# Patient Record
Sex: Male | Born: 1965 | Race: White | Marital: Single | Smoking: Never smoker
Health system: Southern US, Community
[De-identification: ages and names within clinical notes are randomized; demographics above are authoritative.]

## PROBLEM LIST (undated history)

## (undated) DIAGNOSIS — K219 Gastro-esophageal reflux disease without esophagitis: Secondary | ICD-10-CM

---

## 2015-03-16 ENCOUNTER — Other Ambulatory Visit: Payer: Self-pay | Admitting: Student

## 2015-04-15 ENCOUNTER — Other Ambulatory Visit: Payer: Self-pay | Admitting: Student

## 2015-04-15 DIAGNOSIS — M24159 Other articular cartilage disorders, unspecified hip: Secondary | ICD-10-CM

## 2015-04-15 DIAGNOSIS — M169 Osteoarthritis of hip, unspecified: Principal | ICD-10-CM

## 2015-05-30 ENCOUNTER — Ambulatory Visit
Admission: RE | Admit: 2015-05-30 | Discharge: 2015-05-30 | Disposition: A | Discharge status: Home / Self Care | Payer: Non-veteran care | Source: Ambulatory Visit | Attending: Student | Admitting: Student

## 2015-05-30 DIAGNOSIS — M24159 Other articular cartilage disorders, unspecified hip: Secondary | ICD-10-CM

## 2015-05-30 DIAGNOSIS — M169 Osteoarthritis of hip, unspecified: Principal | ICD-10-CM

## 2015-10-02 HISTORY — PX: TOTAL HIP ARTHROPLASTY: SHX124

## 2016-05-04 IMAGING — MR MR HIP*L* W/O CM
4 of 6 series · 15 of 40 positions shown · non-contrast
Comparison: None.

CLINICAL DATA: Left hip pain for 6 years.

EXAM:
MR OF THE LEFT HIP WITHOUT CONTRAST
TECHNIQUE: Multiplanar, multisequence MR imaging was performed. No intravenous
contrast was administered.

[Series 4: T1 · coronal · 4.0mm · 0.49mm/px · 3 of 25 slices shown (1 of 2)]
[im 5/25]
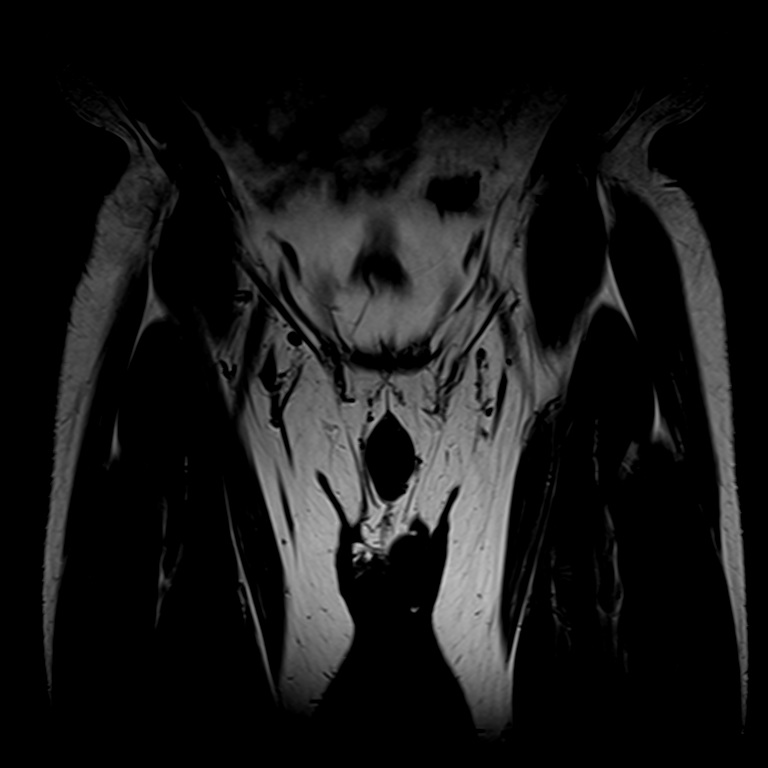
[im 15/25]
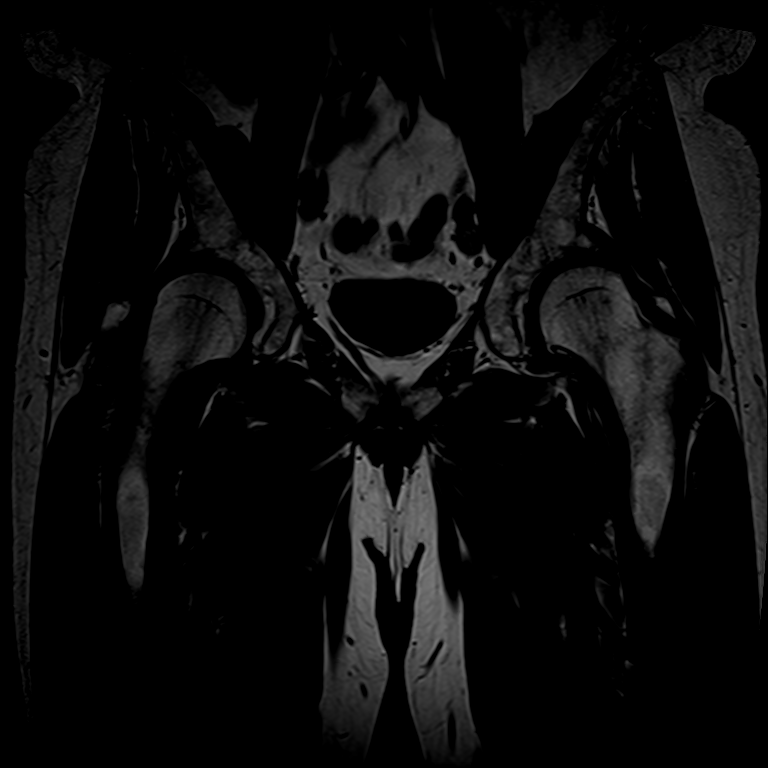
[im 25/25]
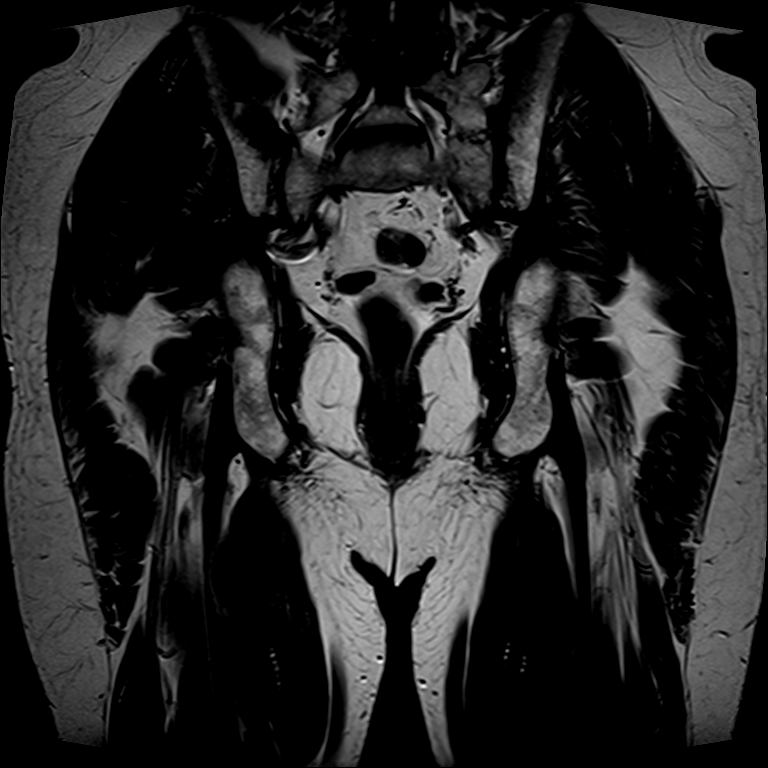

[Series 5: T2 fat-sat · axial · 4.0mm · 0.52mm/px · z∈[-41,+79]mm · 3 of 34 slices shown]
[im 5/34]
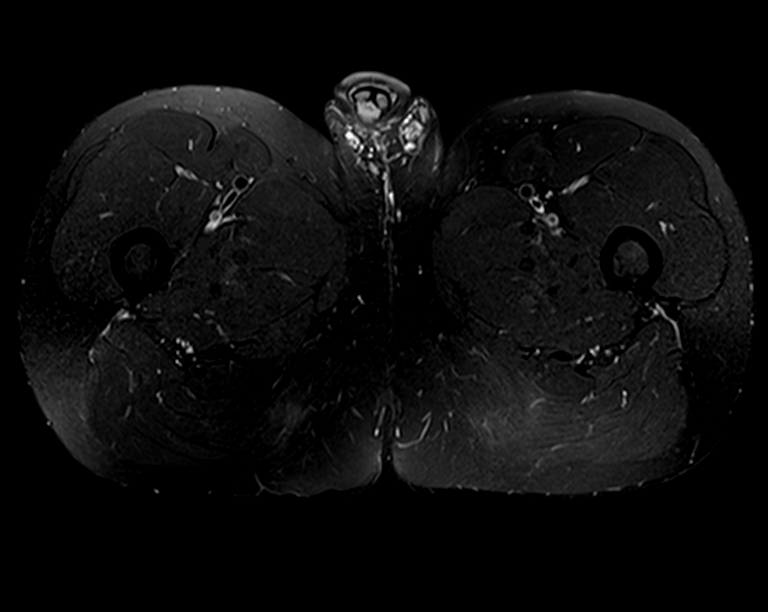
[im 19/34]
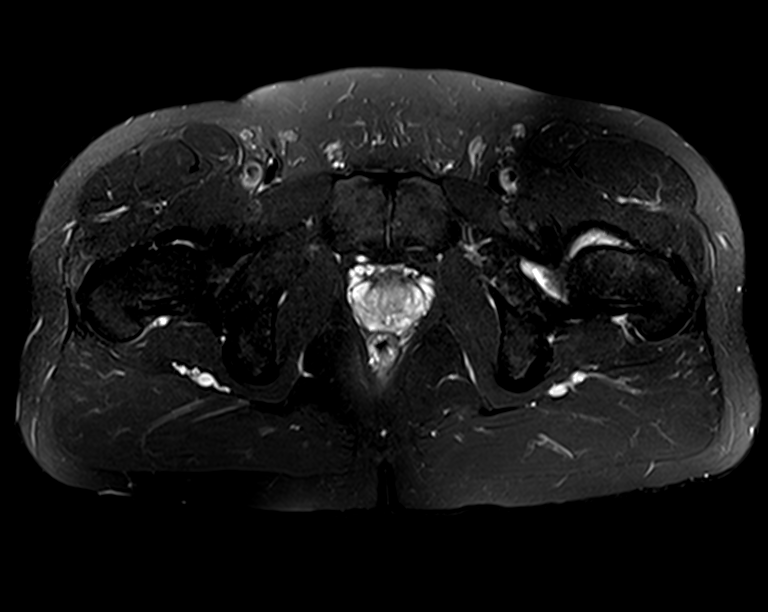
[im 29/34]
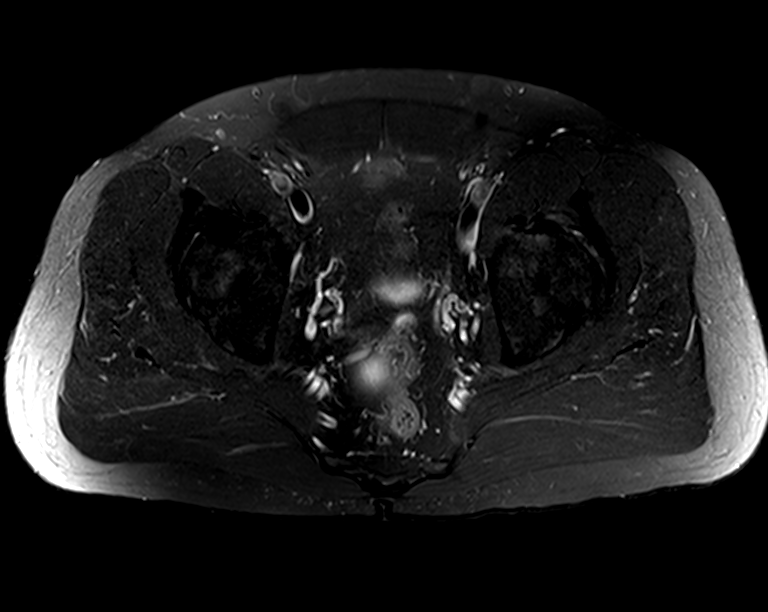

[Series 6: T1 · axial · 4.0mm · 0.52mm/px · z∈[-41,+79]mm · 3 of 34 slices shown (2 of 2)]
[im 5/34]
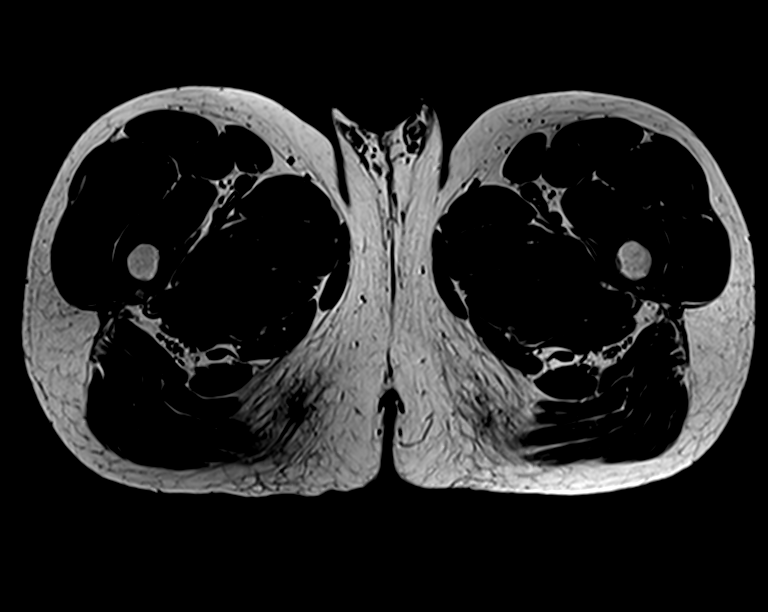
[im 19/34]
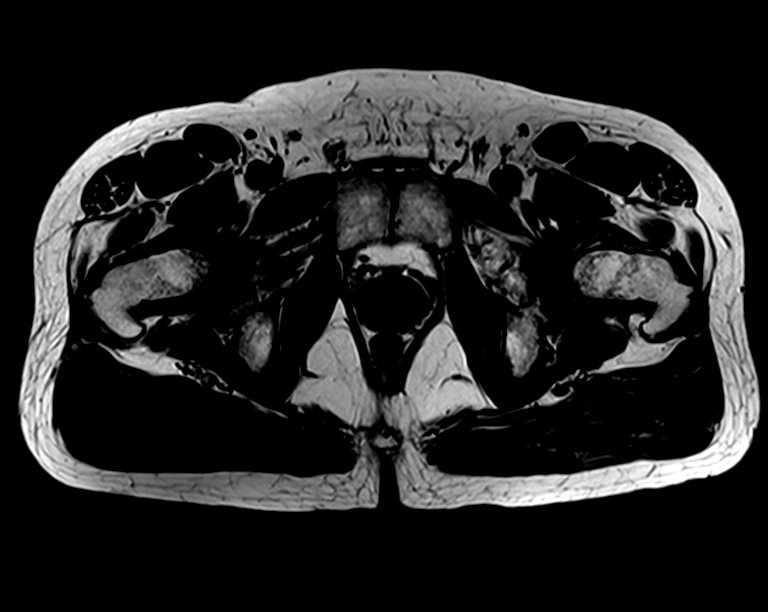
[im 29/34]
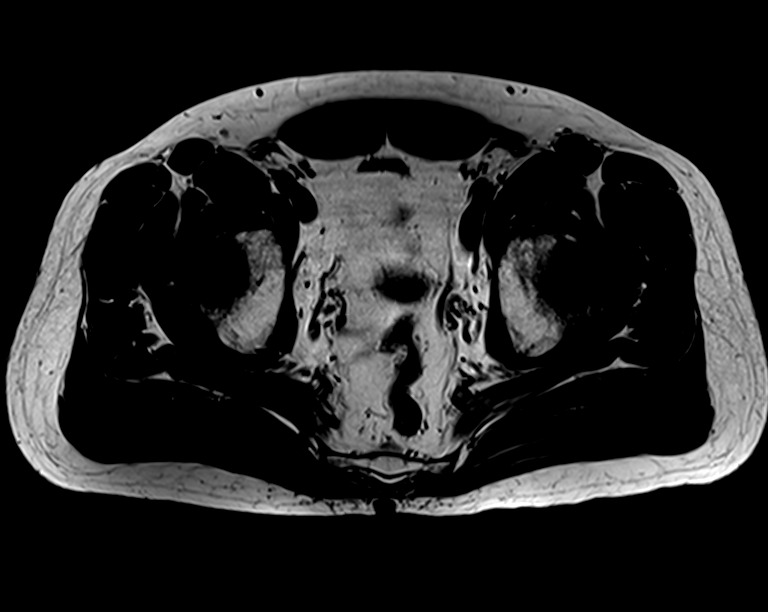

[Series 7: PD · sagittal · 4.0mm · 0.78mm/px · 6 of 23 slices shown]
[im 1/23]
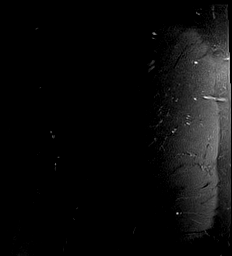
[im 5/23]
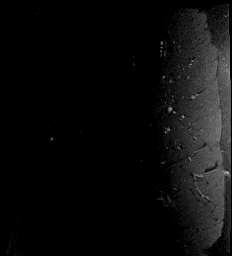
[im 9/23]
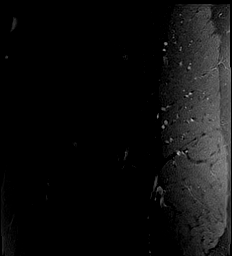
[im 14/23]
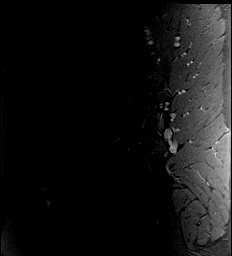
[im 18/23]
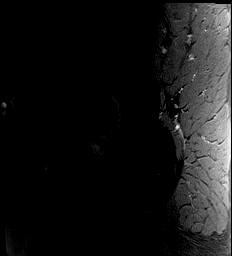
[im 23/23]
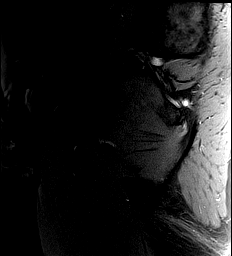

[15 of 40 positions shown; findings below may reference images not displayed]

FINDINGS: Bones:

Articular cartilage and labrum

Articular cartilage:

Labrum:

Joint or bursal effusion

Joint effusion:

Bursae:

Muscles and tendons

Muscles and tendons:

Other findings

Miscellaneous: Both hips are normally located. There are moderate
degenerative changes involving the right hip and advanced
degenerative changes involving the left hip. There is joint space
narrowing, osteophytic spurring and subchondral cystic change. No
stress fracture or AVN. There is a small left hip joint effusion. No
periarticular fluid collections.

The pubic symphysis and SI joints are intact. Mild to moderate SI
joint degenerative changes. No pelvic fractures. The surrounding hip
and pelvic musculature appear normal. No muscle tear, myositis or
fatty atrophy. The hamstring tendons appear normal. No findings for
gluteus medius tendinitis or trochanteric bursitis.

No significant intrapelvic abnormalities. Small left inguinal hernia
containing fat.
IMPRESSION: 1. Age advanced left hip joint degenerative changes. No stress
fracture or AVN. Small joint effusion.
2. Mild to moderate right hip joint degenerative changes.
3. SI joint degenerative changes. No pelvic stress fracture or bone
lesion.
4. Normal MR appearance of the surrounding hip and pelvic
musculature.

## 2018-01-08 ENCOUNTER — Other Ambulatory Visit: Payer: Self-pay | Admitting: Internal Medicine

## 2018-01-08 DIAGNOSIS — R42 Dizziness and giddiness: Secondary | ICD-10-CM

## 2018-01-10 ENCOUNTER — Other Ambulatory Visit: Payer: Self-pay

## 2018-01-10 ENCOUNTER — Ambulatory Visit (HOSPITAL_COMMUNITY): Payer: Non-veteran care | Attending: Cardiology

## 2018-01-10 ENCOUNTER — Encounter (INDEPENDENT_AMBULATORY_CARE_PROVIDER_SITE_OTHER): Payer: Self-pay

## 2018-01-10 DIAGNOSIS — E785 Hyperlipidemia, unspecified: Secondary | ICD-10-CM | POA: Insufficient documentation

## 2018-01-10 DIAGNOSIS — R42 Dizziness and giddiness: Secondary | ICD-10-CM

## 2019-07-06 ENCOUNTER — Ambulatory Visit
Admission: EM | Admit: 2019-07-06 | Discharge: 2019-07-06 | Disposition: A | Discharge status: Home / Self Care | Payer: Non-veteran care

## 2019-07-06 DIAGNOSIS — M7021 Olecranon bursitis, right elbow: Secondary | ICD-10-CM | POA: Diagnosis not present

## 2019-07-06 HISTORY — DX: Gastro-esophageal reflux disease without esophagitis: K21.9

## 2019-07-06 NOTE — Discharge Instructions (Signed)
May take naproxen/Aleve 220 mg tabs: Up to 2 tabs twice daily with food for the next week. Follow instructions provided in discharge packet. Return for worsening pain, redness, fever, pain with movement.

## 2019-07-06 NOTE — ED Provider Notes (Signed)
[name]    CSN: [id number] Arrival date & time: 07/06/19  1319      History   Chief Complaint Chief Complaint  Patient presents with  . Abscess    HPI Jay Stafford is a 53 y.o. male with history of acid reflux presenting for right elbow swelling for the last week.  Patient denies trauma to the area, known inciting event.  States it is nontender, without erythema.  Denies history of abscess.  Has never had this before.  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  Has not tried anything for symptoms.   Past Medical History:  Diagnosis Date  . Acid reflux     There are no active problems to display for this patient.   Past Surgical History:  Procedure Laterality Date  . TOTAL HIP ARTHROPLASTY Left 2017       Home Medications    Prior to Admission medications   Medication Sig Start Date End Date Taking? Authorizing Provider  omeprazole (PRILOSEC) 20 MG capsule Take 20 mg by mouth daily.   Yes [provider]    Family History No family history on file.  Social History Social History   Tobacco Use  . Smoking status: Never Smoker  . Smokeless tobacco: Never Used  Substance Use Topics  . Alcohol use: Not Currently  . Drug use: Not on file     Allergies   Patient has no known allergies.   Review of Systems Review of Systems  Constitutional: Negative for fatigue and fever.  Respiratory: Negative for cough and shortness of breath.   Cardiovascular: Negative for chest pain and palpitations.  Gastrointestinal: Negative for abdominal pain, diarrhea and vomiting.  Musculoskeletal: Negative for arthralgias and myalgias.  Skin: Negative for rash and wound.  Neurological: Negative for speech difficulty and headaches.  All other systems reviewed and are negative.    Physical Exam Triage Vital Signs ED Triage Vitals [07/06/19 1350]  Enc Vitals  Group     BP (!) 139/93     Pulse Rate (!) 59     Resp 20     Temp 97.9 F (36.6 C)     Temp Source Oral     SpO2 96 %     Weight      Height      Head Circumference      Peak Flow      Pain Score 0     Pain Loc      Pain Edu?      Excl. in GC?    No data found.  Updated Vital Signs BP (!) 139/93 (BP Location: Left Arm)   Pulse (!) 59   Temp 97.9 F (36.6 C) (Oral)   Resp 20   SpO2 96%   Visual Acuity Right Eye Distance:   Left Eye Distance:   Bilateral Distance:    Right Eye Near:   Left Eye Near:    Bilateral Near:     Physical Exam Constitutional:      General: He is not in acute distress. HENT:     Head: Normocephalic and atraumatic.  Eyes:     General: No scleral icterus.    Pupils: Pupils are equal, round, and reactive to light.  Cardiovascular:     Rate and Rhythm: Normal rate.  Pulmonary:     Effort: Pulmonary effort is normal.  Musculoskeletal: Normal range of motion.  General: Deformity present. No tenderness.  Lymphadenopathy:     Comments: No surrounding lymphadenopathy in right elbow  Skin:    Coloration: Skin is not jaundiced or pale.     Comments: 5 cm round area of fluctuance near olecranon of right elbow.  Nontender, without warmth, discharge.  Neurological:     Mental Status: He is alert and oriented to person, place, and time.      UC Treatments / Results  Labs (all labs ordered are listed, but only abnormal results are displayed) Labs Reviewed - No data to display  EKG   Radiology No results found.  Procedures Join Aspiration/Injection  Date/Time: 07/06/2019 7:50 PM Performed by: Quincy Sheehan, PA-C Authorized by: Quincy Sheehan, PA-C   Consent:    Consent obtained:  Verbal   Consent given by:  Patient   Risks discussed:  Bleeding, infection, pain and incomplete drainage   Alternatives discussed:  No treatment, observation and delayed treatment Location:    Location:  Elbow   Elbow:  R elbow  Anesthesia (see MAR for exact dosages):    Anesthesia method:  Topical application   Topical anesthesia: Lidocaine spray. Procedure details:    Preparation: Patient was prepped and draped in usual sterile fashion     Needle gauge:  18 G   Ultrasound guidance: no     Approach:  Inferior   Aspirate amount:  10 cc   Aspirate characteristics:  Blood-tinged and serous   Steroid injected: no     Specimen collected: no   Post-procedure details:    Dressing:  Adhesive bandage   Patient tolerance of procedure:  Tolerated well, no immediate complications   (including critical care time)  Medications Ordered in UC Medications - No data to display  Initial Impression / Assessment and Plan / UC Course  I have reviewed the triage vital signs and the nursing notes.  Pertinent labs & imaging results that were available during my care of the patient were reviewed by me and considered in my medical decision making (see chart for details).     1.  Olecranon bursitis of right elbow Needle aspiration done in office, patient tolerated well.  Will treat with NSAIDs, compression, ice as listed below.  Return precautions discussed, patient verbalized understanding and is agreeable to plan. Final Clinical Impressions(s) / UC Diagnoses   Final diagnoses:  Olecranon bursitis of right elbow     Discharge Instructions     May take naproxen/Aleve 220 mg tabs: Up to 2 tabs twice daily with food for the next week. Follow instructions provided in discharge packet. Return for worsening pain, redness, fever, pain with movement.    ED Prescriptions    None     PDMP not reviewed this encounter.   Jay Stafford, [place] 07/06/19 1952

## 2019-07-06 NOTE — ED Triage Notes (Signed)
Pt c/o swelling to rt elbow x1 wk. Denies pain or itching or redness
